# Patient Record
Sex: Female | Born: 2000 | Hispanic: Yes | Marital: Single | Smoking: Never smoker
Health system: Southern US, Community
[De-identification: ages and names within clinical notes are randomized; demographics above are authoritative.]

## PROBLEM LIST (undated history)

## (undated) DIAGNOSIS — J45909 Unspecified asthma, uncomplicated: Secondary | ICD-10-CM

---
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
## 2021-06-05 ENCOUNTER — Encounter: Payer: Self-pay | Admitting: Obstetrics and Gynecology

## 2021-06-05 ENCOUNTER — Ambulatory Visit (INDEPENDENT_AMBULATORY_CARE_PROVIDER_SITE_OTHER): Payer: Medicaid Other | Admitting: Obstetrics and Gynecology

## 2021-06-05 ENCOUNTER — Other Ambulatory Visit: Payer: Self-pay

## 2021-06-05 VITALS — BP 129/65 | HR 76 | Resp 16 | Ht 65.0 in | Wt 242.5 lb

## 2021-06-05 DIAGNOSIS — R102 Pelvic and perineal pain: Secondary | ICD-10-CM | POA: Diagnosis not present

## 2021-06-05 DIAGNOSIS — R109 Unspecified abdominal pain: Secondary | ICD-10-CM | POA: Diagnosis not present

## 2021-06-05 DIAGNOSIS — N912 Amenorrhea, unspecified: Secondary | ICD-10-CM

## 2021-06-05 DIAGNOSIS — Z6841 Body Mass Index (BMI) 40.0 and over, adult: Secondary | ICD-10-CM

## 2021-06-05 LAB — POCT URINALYSIS DIPSTICK
Bilirubin, UA: NEGATIVE
Blood, UA: NEGATIVE
Glucose, UA: NEGATIVE
Ketones, UA: NEGATIVE
Leukocytes, UA: NEGATIVE
Nitrite, UA: NEGATIVE
Protein, UA: POSITIVE — AB
Spec Grav, UA: 1.02 (ref 1.010–1.025)
Urobilinogen, UA: 0.2 E.U./dL
pH, UA: 6 (ref 5.0–8.0)

## 2021-06-05 LAB — POCT URINE PREGNANCY: Preg Test, Ur: NEGATIVE

## 2021-06-05 NOTE — Progress Notes (Signed)
HPI:      Sherry Banks is a 20 y.o. No obstetric history on file. who LMP was No LMP recorded.  Subjective:   She presents today with complaint of intermittent pelvic pain.  She states that she gets it several days each month about the time when "I usually would expect my period" she reports the pain as noncrampy.  Points to midline lower abdomen.  She does occasionally have pain with intercourse but it is not the same pain.  The pain she describes above is not related to intercourse, bowel movements, urination or any other specific activity.  She does not have normal regular monthly menses in fact has not had a period in more than 9 months.  She states that ever since menarche she has been having irregular cycles.  The only time there was any regularity and when it was not heavy was for a 23-month period while she was on OCPs.  She is concerned about not having periods but does not want to have extremely heavy periods each month either.  She is currently using condoms for birth control. She had a partial endocrine work-up performed by her family physician which showed normal FSH and LH, normal TSH normal prolactin and an elevated hemoglobin A1c (consistent with prediabetes).    Hx: The following portions of the patient's history were reviewed and updated as appropriate:             She  has no past medical history on file. She does not have a problem list on file. She  has no past surgical history on file. Her family history is not on file. She  reports that she has never smoked. She has never used smokeless tobacco. She reports that she does not drink alcohol and does not use drugs. She has a current medication list which includes the following prescription(s): albuterol, fluticasone, and naproxen. She has No Known Allergies.       Review of Systems:  Review of Systems  Constitutional: Denied constitutional symptoms, night sweats, recent illness, fatigue, fever, insomnia and weight  loss.  Eyes: Denied eye symptoms, eye pain, photophobia, vision change and visual disturbance.  Ears/Nose/Throat/Neck: Denied ear, nose, throat or neck symptoms, hearing loss, nasal discharge, sinus congestion and sore throat.  Cardiovascular: Denied cardiovascular symptoms, arrhythmia, chest pain/pressure, edema, exercise intolerance, orthopnea and palpitations.  Respiratory: Denied pulmonary symptoms, asthma, pleuritic pain, productive sputum, cough, dyspnea and wheezing.  Gastrointestinal: Denied, gastro-esophageal reflux, melena, nausea and vomiting.  Genitourinary: See HPI for additional information.  Musculoskeletal: Denied musculoskeletal symptoms, stiffness, swelling, muscle weakness and myalgia.  Dermatologic: Denied dermatology symptoms, rash and scar.  Neurologic: Denied neurology symptoms, dizziness, headache, neck pain and syncope.  Psychiatric: Denied psychiatric symptoms, anxiety and depression.  Endocrine: See HPI for additional information.   Meds:   Current Outpatient Medications on File Prior to Visit  Medication Sig Dispense Refill   albuterol (VENTOLIN HFA) 108 (90 Base) MCG/ACT inhaler Inhale into the lungs.     fluticasone (FLOVENT HFA) 110 MCG/ACT inhaler Inhale into the lungs.     naproxen (NAPROSYN) 500 MG tablet Take 1 tablet (500 mg total) by mouth 2 (two) times daily with a meal. 30 tablet 0   No current facility-administered medications on file prior to visit.      Objective:     Vitals:   06/05/21 1122  BP: 129/65  Pulse: 76  Resp: 16   Filed Weights   06/05/21 1122  Weight: 242 lb  8 oz (110 kg)                        Assessment:    No obstetric history on file. There are no problems to display for this patient.    1. Pelvic pain in female   2. Abdominal pain, unspecified abdominal location   3. Amenorrhea   4. Morbid obesity with BMI of 40.0-44.9, adult [facility])     Patient possibly has PCO based on likely anovulation, obesity, and  elevated hemoglobin A1c with likely insulin resistance.  Unknown source of pelvic pain.   Plan:            1.  Pelvic ultrasound to further evaluate uterus and ovaries  2.  Recommend completing work-up for possible PCO.  3.  We have briefly discussed cycle control using hormonal methods of birth control. Orders Orders Placed This Encounter  Procedures   US PELVIS (TRANSABDOMINAL ONLY)   US PELVIS TRANSVAGINAL NON-OB (TV ONLY)   DHEA-sulfate   Glucose, fasting   Insulin, random   Testosterone, Free, Total, SHBG   POCT Urinalysis Dipstick   POCT urine pregnancy    No orders of the defined types were placed in this encounter.     F/U  Return in about 3 weeks (around 06/26/2021). I spent 32 minutes involved in the care of this patient preparing to see the patient by obtaining and reviewing her medical history (including labs, imaging tests and prior procedures), documenting clinical information in the electronic health record (EHR), counseling and coordinating care plans, writing and sending prescriptions, ordering tests or procedures and in direct communicating with the patient and medical staff discussing pertinent items from her history and physical exam.  Elonda Husky, M.D. 06/05/2021 11:50 AM

## 2021-06-28 ENCOUNTER — Ambulatory Visit (INDEPENDENT_AMBULATORY_CARE_PROVIDER_SITE_OTHER): Payer: Medicaid Other

## 2021-06-28 ENCOUNTER — Other Ambulatory Visit: Payer: Medicaid Other

## 2021-06-28 ENCOUNTER — Other Ambulatory Visit: Payer: Self-pay

## 2021-06-28 ENCOUNTER — Other Ambulatory Visit: Payer: Self-pay | Admitting: Obstetrics and Gynecology

## 2021-06-28 DIAGNOSIS — R102 Pelvic and perineal pain: Secondary | ICD-10-CM

## 2021-06-30 LAB — TESTOSTERONE, FREE, TOTAL, SHBG
Sex Hormone Binding: 21.8 nmol/L — ABNORMAL LOW (ref 24.6–122.0)
Testosterone, Free: 3.8 pg/mL
Testosterone: 29 ng/dL (ref 13–71)

## 2021-06-30 LAB — DHEA-SULFATE: DHEA-SO4: 121 ug/dL (ref 110.0–433.2)

## 2021-06-30 LAB — GLUCOSE, FASTING: Glucose, Plasma: 85 mg/dL (ref 70–99)

## 2021-06-30 LAB — INSULIN, RANDOM: INSULIN: 63.6 u[IU]/mL — ABNORMAL HIGH (ref 2.6–24.9)

## 2021-07-06 ENCOUNTER — Encounter: Payer: Self-pay | Admitting: Obstetrics and Gynecology

## 2021-07-06 ENCOUNTER — Other Ambulatory Visit: Payer: Self-pay

## 2021-07-06 ENCOUNTER — Ambulatory Visit (INDEPENDENT_AMBULATORY_CARE_PROVIDER_SITE_OTHER): Payer: Medicaid Other | Admitting: Obstetrics and Gynecology

## 2021-07-06 VITALS — BP 114/75 | HR 85 | Resp 16 | Ht 65.0 in | Wt 236.8 lb

## 2021-07-06 DIAGNOSIS — R7303 Prediabetes: Secondary | ICD-10-CM

## 2021-07-06 DIAGNOSIS — N912 Amenorrhea, unspecified: Secondary | ICD-10-CM | POA: Diagnosis not present

## 2021-07-06 DIAGNOSIS — N970 Female infertility associated with anovulation: Secondary | ICD-10-CM | POA: Diagnosis not present

## 2021-07-06 MED ORDER — DESOGESTREL-ETHINYL ESTRADIOL 0.15-0.02/0.01 MG (21/5) PO TABS: 1.0000 | ORAL_TABLET | Freq: Every day | ORAL | 1 refills | Status: DC

## 2021-07-06 MED ORDER — METFORMIN HCL 500 MG PO TABS: 500.0000 mg | ORAL_TABLET | Freq: Two times a day (BID) | ORAL | 1 refills | Status: DC

## 2021-07-06 NOTE — Progress Notes (Signed)
HPI:      [name] is a 20 y.o. No obstetric history on file. who LMP was Patient's last menstrual period was 07/06/2021 (exact date).  Subjective:   She presents today to discuss her oligo ovulation/oligomenorrhea as well as her most recent ultrasound.  She states that she has had a period last week and it was very heavy as if she has not had a period in several months. Her lab work also revealed apparent insulin resistance/prediabetes.    Hx: The following portions of the patient's history were reviewed and updated as appropriate:             She  has no past medical history on file. She does not have a problem list on file. She  has no past surgical history on file. Her family history is not on file. She  reports that she has never smoked. She has never used smokeless tobacco. She reports that she does not drink alcohol and does not use drugs. She has a current medication list which includes the following prescription(s): albuterol, desogestrel-ethinyl estradiol, fluticasone, and metformin. She has No Known Allergies.       Review of Systems:  Review of Systems  Constitutional: Denied constitutional symptoms, night sweats, recent illness, fatigue, fever, insomnia and weight loss.  Eyes: Denied eye symptoms, eye pain, photophobia, vision change and visual disturbance.  Ears/Nose/Throat/Neck: Denied ear, nose, throat or neck symptoms, hearing loss, nasal discharge, sinus congestion and sore throat.  Cardiovascular: Denied cardiovascular symptoms, arrhythmia, chest pain/pressure, edema, exercise intolerance, orthopnea and palpitations.  Respiratory: Denied pulmonary symptoms, asthma, pleuritic pain, productive sputum, cough, dyspnea and wheezing.  Gastrointestinal: Denied, gastro-esophageal reflux, melena, nausea and vomiting.  Genitourinary: See HPI for additional information.  Musculoskeletal: Denied musculoskeletal symptoms, stiffness, swelling, muscle weakness and  myalgia.  Dermatologic: Denied dermatology symptoms, rash and scar.  Neurologic: Denied neurology symptoms, dizziness, headache, neck pain and syncope.  Psychiatric: Denied psychiatric symptoms, anxiety and depression.  Endocrine: See HPI for additional information.   Meds:   Current Outpatient Medications on File Prior to Visit  Medication Sig Dispense Refill   albuterol (VENTOLIN HFA) 108 (90 Base) MCG/ACT inhaler Inhale into the lungs.     fluticasone (FLOVENT HFA) 110 MCG/ACT inhaler Inhale into the lungs.     No current facility-administered medications on file prior to visit.      Objective:     Vitals:   07/06/21 1523  BP: 114/75  Pulse: 85  Resp: 16   Filed Weights   07/06/21 1523  Weight: 236 lb 12.8 oz (107.4 kg)              Ultrasound results reviewed directly with the patient          Assessment:    No obstetric history on file. There are no problems to display for this patient.    1. Oligo-ovulation with amenorrhea   2. Pre-diabetes        Plan:            1.  Patient would like to start OCPs to regulate her cycle and for birth control.  We will start Mircette. OCPs The risks /benefits of OCPs have been explained to the patient in detail.  Product literature has been given to her where appropriate.  I have instructed her in the use of OCPs.  I have explained to the patient that OCPs are not as effective for birth control during the first month of use, and that  another form of contraception should be used during this time.  Both first-day start and Sunday start have been explained.  The risks and benefits of each was discussed.  She has been made aware of  the fact that in rare circumstances, other medications may affect the efficacy of OCPs.  I have answered all of her questions, and I believe that she has an understanding of the effectiveness and use of OCPs.   2.  We will start metformin for prediabetes -plan recheck hemoglobin A1c in 3 months.   Discussed possibility of dietary modification and weight loss. Orders No orders of the defined types were placed in this encounter.    Meds ordered this encounter  Medications   metFORMIN (GLUCOPHAGE) 500 MG tablet    Sig: Take 1 tablet (500 mg total) by mouth 2 (two) times daily with a meal. 500 at bedtime for 1 wk.  Then increase to twice per day with meals as directed.    Dispense:  100 tablet    Refill:  1   desogestrel-ethinyl estradiol (MIRCETTE) 0.15-0.02/0.01 MG (21/5) tablet    Sig: Take 1 tablet by mouth at bedtime.    Dispense:  84 tablet    Refill:  1      F/U  Return in about 3 months (around 10/06/2021). I spent 23 minutes involved in the care of this patient preparing to see the patient by obtaining and reviewing her medical history (including labs, imaging tests and prior procedures), documenting clinical information in the electronic health record (EHR), counseling and coordinating care plans, writing and sending prescriptions, ordering tests or procedures and in direct communicating with the patient and medical staff discussing pertinent items from her history and physical exam.  Elonda Husky, M.D. 07/06/2021 3:47 PM

## 2021-10-06 ENCOUNTER — Other Ambulatory Visit: Payer: Self-pay

## 2021-10-06 ENCOUNTER — Encounter: Payer: Self-pay | Admitting: Obstetrics and Gynecology

## 2021-10-06 ENCOUNTER — Ambulatory Visit (INDEPENDENT_AMBULATORY_CARE_PROVIDER_SITE_OTHER): Payer: Medicaid Other | Admitting: Obstetrics and Gynecology

## 2021-10-06 VITALS — BP 108/76 | HR 76 | Ht 65.0 in | Wt 219.3 lb

## 2021-10-06 DIAGNOSIS — N912 Amenorrhea, unspecified: Secondary | ICD-10-CM | POA: Diagnosis not present

## 2021-10-06 DIAGNOSIS — N970 Female infertility associated with anovulation: Secondary | ICD-10-CM | POA: Diagnosis not present

## 2021-10-06 DIAGNOSIS — R7303 Prediabetes: Secondary | ICD-10-CM | POA: Diagnosis not present

## 2021-10-06 MED ORDER — METFORMIN HCL 500 MG PO TABS: 500.0000 mg | ORAL_TABLET | Freq: Two times a day (BID) | ORAL | 1 refills | Status: DC

## 2021-10-06 MED ORDER — DESOGESTREL-ETHINYL ESTRADIOL 0.15-0.02/0.01 MG (21/5) PO TABS: 1.0000 | ORAL_TABLET | Freq: Every day | ORAL | 1 refills | Status: DC

## 2021-10-06 MED ORDER — METFORMIN HCL 500 MG PO TABS: 500.0000 mg | ORAL_TABLET | Freq: Two times a day (BID) | ORAL | 1 refills | Status: AC

## 2021-10-06 NOTE — Addendum Note (Signed)
Addended by: Loman Chroman on: 10/06/2021 09:47 AM   Modules accepted: Orders

## 2021-10-06 NOTE — Progress Notes (Signed)
Patient states periods have started to become regular over the past 2 months. Patient states not taking her previously prescribed OCP's or metformin. Patient states no further questions.

## 2021-10-06 NOTE — Progress Notes (Signed)
HPI:      [name] is a 21 y.o. No obstetric history on file. who LMP was Patient's last menstrual period was 10/04/2021 (approximate).  Subjective:   She presents today for scheduled follow-up regarding OCPs and metformin.  However, she states that the prescription was "sent to the wrong pharmacy" and she never got it.  She does report that she has now started to have regular monthly menstrual periods.  She continues to use condoms for birth control but would still like to start OCPs.    Hx: The following portions of the patient's history were reviewed and updated as appropriate:             She  has no past medical history on file. She does not have a problem list on file. She  has no past surgical history on file. Her family history is not on file. She  reports that she has never smoked. She has never used smokeless tobacco. She reports that she does not drink alcohol and does not use drugs. She has a current medication list which includes the following prescription(s): albuterol, fluticasone, desogestrel-ethinyl estradiol, and metformin. She has No Known Allergies.       Review of Systems:  Review of Systems  Constitutional: Denied constitutional symptoms, night sweats, recent illness, fatigue, fever, insomnia and weight loss.  Eyes: Denied eye symptoms, eye pain, photophobia, vision change and visual disturbance.  Ears/Nose/Throat/Neck: Denied ear, nose, throat or neck symptoms, hearing loss, nasal discharge, sinus congestion and sore throat.  Cardiovascular: Denied cardiovascular symptoms, arrhythmia, chest pain/pressure, edema, exercise intolerance, orthopnea and palpitations.  Respiratory: Denied pulmonary symptoms, asthma, pleuritic pain, productive sputum, cough, dyspnea and wheezing.  Gastrointestinal: Denied, gastro-esophageal reflux, melena, nausea and vomiting.  Genitourinary: Denied genitourinary symptoms including symptomatic vaginal discharge, pelvic  relaxation issues, and urinary complaints.  Musculoskeletal: Denied musculoskeletal symptoms, stiffness, swelling, muscle weakness and myalgia.  Dermatologic: Denied dermatology symptoms, rash and scar.  Neurologic: Denied neurology symptoms, dizziness, headache, neck pain and syncope.  Psychiatric: Denied psychiatric symptoms, anxiety and depression.  Endocrine: Denied endocrine symptoms including hot flashes and night sweats.   Meds:   Current Outpatient Medications on File Prior to Visit  Medication Sig Dispense Refill   albuterol (VENTOLIN HFA) 108 (90 Base) MCG/ACT inhaler Inhale into the lungs.     fluticasone (FLOVENT HFA) 110 MCG/ACT inhaler Inhale into the lungs.     No current facility-administered medications on file prior to visit.      Objective:     Vitals:   10/06/21 0851  BP: 108/76  Pulse: 76   Filed Weights   10/06/21 0851  Weight: 219 lb 4.8 oz (99.5 kg)                        Assessment:    No obstetric history on file. There are no problems to display for this patient.    1. Oligo-ovulation with amenorrhea   2. Pre-diabetes        Plan:            1.  Patient will start OCPs as previously discussed.   OCPs The risks /benefits of OCPs have been explained to the patient in detail.  Product literature has been given to her where appropriate.  I have instructed her in the use of OCPs.  I have explained to the patient that OCPs are not as effective for birth control during the first month of use, and  that another form of contraception should be used during this time.  Both first-day start and Sunday start have been explained.  The risks and benefits of each was discussed.  She has been made aware of  the fact that in rare circumstances, other medications may affect the efficacy of OCPs.  I have answered all of her questions, and I believe that she has an understanding of the effectiveness and use of OCPs.  2.  Begin metformin as previously  discussed.  Plan follow-up for OCPs and metformin in 3 months.  We will obtain hemoglobin A1c at that time. Orders No orders of the defined types were placed in this encounter.    Meds ordered this encounter  Medications   desogestrel-ethinyl estradiol (MIRCETTE) 0.15-0.02/0.01 MG (21/5) tablet    Sig: Take 1 tablet by mouth at bedtime.    Dispense:  84 tablet    Refill:  1   metFORMIN (GLUCOPHAGE) 500 MG tablet    Sig: Take 1 tablet (500 mg total) by mouth 2 (two) times daily with a meal. 500 at bedtime for 1 wk.  Then increase to twice per day with meals as directed.    Dispense:  100 tablet    Refill:  1      F/U  Return in about 3 months (around 01/03/2022). I spent 21 minutes involved in the care of this patient preparing to see the patient by obtaining and reviewing her medical history (including labs, imaging tests and prior procedures), documenting clinical information in the electronic health record (EHR), counseling and coordinating care plans, writing and sending prescriptions, ordering tests or procedures and in direct communicating with the patient and medical staff discussing pertinent items from her history and physical exam.  Finis Bud, M.D. 10/06/2021 9:24 AM

## 2021-10-11 ENCOUNTER — Other Ambulatory Visit: Payer: Self-pay

## 2021-10-11 DIAGNOSIS — N912 Amenorrhea, unspecified: Secondary | ICD-10-CM

## 2021-10-11 DIAGNOSIS — N970 Female infertility associated with anovulation: Secondary | ICD-10-CM

## 2021-10-11 MED ORDER — DESOGESTREL-ETHINYL ESTRADIOL 0.15-0.02/0.01 MG (21/5) PO TABS: 1.0000 | ORAL_TABLET | Freq: Every day | ORAL | 1 refills | Status: AC

## 2021-10-11 NOTE — Telephone Encounter (Signed)
Patient requested birth control be sent to [facility] on [address]. Medication has been sent and patient made aware.

## 2022-01-03 ENCOUNTER — Encounter: Payer: Medicaid Other | Admitting: Obstetrics and Gynecology

## 2022-01-03 DIAGNOSIS — R7303 Prediabetes: Secondary | ICD-10-CM

## 2022-01-03 DIAGNOSIS — N912 Amenorrhea, unspecified: Secondary | ICD-10-CM

## 2023-03-26 ENCOUNTER — Emergency Department
Admission: EM | Admit: 2023-03-26 | Discharge: 2023-03-26 | Disposition: A | Discharge status: Home / Self Care | Payer: Medicaid Other | Attending: Student in an Organized Health Care Education/Training Program | Admitting: Student in an Organized Health Care Education/Training Program

## 2023-03-26 ENCOUNTER — Other Ambulatory Visit: Payer: Self-pay

## 2023-03-26 ENCOUNTER — Emergency Department: Payer: Medicaid Other

## 2023-03-26 DIAGNOSIS — B349 Viral infection, unspecified: Secondary | ICD-10-CM | POA: Insufficient documentation

## 2023-03-26 DIAGNOSIS — J40 Bronchitis, not specified as acute or chronic: Secondary | ICD-10-CM

## 2023-03-26 DIAGNOSIS — R509 Fever, unspecified: Secondary | ICD-10-CM | POA: Diagnosis present

## 2023-03-26 DIAGNOSIS — Z20822 Contact with and (suspected) exposure to covid-19: Secondary | ICD-10-CM | POA: Diagnosis not present

## 2023-03-26 LAB — SARS CORONAVIRUS 2 BY RT PCR: SARS Coronavirus 2 by RT PCR: NEGATIVE

## 2023-03-26 LAB — GROUP A STREP BY PCR: Group A Strep by PCR: NOT DETECTED

## 2023-03-26 MED ORDER — ACETAMINOPHEN 500 MG PO TABS
1000.0000 mg | ORAL_TABLET | Freq: Once | ORAL | Status: AC
  Administered 2023-03-26: 1000 mg via ORAL
  Filled 2023-03-26: qty 2

## 2023-03-26 MED ORDER — PSEUDOEPH-BROMPHEN-DM 30-2-10 MG/5ML PO SYRP: 10.0000 mL | ORAL_SOLUTION | Freq: Four times a day (QID) | ORAL | 0 refills | Status: DC | PRN

## 2023-03-26 MED ORDER — PREDNISONE 20 MG PO TABS
60.0000 mg | ORAL_TABLET | Freq: Once | ORAL | Status: AC
  Administered 2023-03-26: 60 mg via ORAL
  Filled 2023-03-26: qty 3

## 2023-03-26 MED ORDER — PREDNISONE 50 MG PO TABS: 50.0000 mg | ORAL_TABLET | Freq: Every day | ORAL | 0 refills | Status: DC

## 2023-03-26 MED ORDER — IBUPROFEN 600 MG PO TABS
600.0000 mg | ORAL_TABLET | Freq: Once | ORAL | Status: AC
  Administered 2023-03-26: 600 mg via ORAL
  Filled 2023-03-26: qty 1

## 2023-03-26 MED ORDER — BENZONATATE 100 MG PO CAPS: 100.0000 mg | ORAL_CAPSULE | Freq: Three times a day (TID) | ORAL | 0 refills | Status: DC | PRN

## 2023-03-26 NOTE — ED Triage Notes (Signed)
Pt presents to ER with c/o dry cough, sore throat, body aches and fever that started 8/4.  Pt reports she has been taking OTC for cough/cold symptoms, but states she has had little to no relief.  Pt reports she has worked around people who have been sick with covid.  Pt is otherwise A&O x4 and in NAD in triage.

## 2023-03-26 NOTE — ED Provider Notes (Signed)
[facility] Provider Note  Patient Contact: 9:35 PM (approximate)   History   Fever and Cough   HPI  Sherry Banks is a 22 y.o. female who for possible evaluation of COVID.  Patient states that she has been around somebody that was positive for COVID.  She has had congestion, cough, wheezing, posttussive emesis.  Patient states that symptoms have been ongoing x 3 days.  Patient denies chest pain, abdominal pain, urinary symptoms.     Physical Exam   Triage Vital Signs: ED Triage Vitals  Encounter Vitals Group     BP 03/26/23 2003 122/77     Systolic BP Percentile --      Diastolic BP Percentile --      Pulse Rate 03/26/23 2003 (!) 125     Resp 03/26/23 2003 (!) 22     Temp 03/26/23 2003 (!) 101.5 F (38.6 C)     Temp Source 03/26/23 2003 Oral     SpO2 03/26/23 2003 96 %     Weight --      Height --      Head Circumference --      Peak Flow --      Pain Score 03/26/23 2100 3     Pain Loc --      Pain Education --      Exclude from Growth Chart --     Most recent vital signs: Vitals:   03/26/23 2145 03/26/23 2156  BP:    Pulse:  (!) 109  Resp:  20  Temp: 99.8 F (37.7 C)   SpO2:  97%     General: Alert and in no acute distress. ENT:      Ears:       Nose: No congestion/rhinnorhea.      Mouth/Throat: Mucous membranes are moist. Neck: No stridor. No cervical spine tenderness to palpation.  Cardiovascular:  Good peripheral perfusion Respiratory: Normal respiratory effort without tachypnea or retractions. Lungs CTAB. Good air entry to the bases with no decreased or absent breath sounds. Musculoskeletal: Full range of motion to all extremities.  Neurologic:  No gross focal neurologic deficits are appreciated.  Skin:   No rash noted Other:   ED Results / Procedures / Treatments   Labs (all labs ordered are listed, but only abnormal results are displayed) Labs Reviewed  SARS CORONAVIRUS 2 BY RT PCR  GROUP A STREP BY PCR      EKG     RADIOLOGY  I personally viewed, evaluated, and interpreted these images as part of my medical decision making, as well as reviewing the written report by the radiologist.  ED Provider Interpretation: No acute consolidation concerning for pneumonia  DG Chest 2 View  Result Date: 03/26/2023 CLINICAL DATA:  Cough, sore throat, body aches and fever. EXAM: CHEST - 2 VIEW COMPARISON:  March 18, 2005 FINDINGS: The heart size and mediastinal contours are within normal limits. Low lung volumes are noted. There is no evidence of acute infiltrate, pleural effusion or pneumothorax. The visualized skeletal structures are unremarkable. IMPRESSION: No active cardiopulmonary disease. Electronically Signed   By: Aram Candela M.D.   On: 03/26/2023 21:51    PROCEDURES:  Critical Care performed: No  Procedures   MEDICATIONS ORDERED IN ED: Medications  acetaminophen (TYLENOL) tablet 1,000 mg (1,000 mg Oral Given 03/26/23 2009)  ibuprofen (ADVIL) tablet 600 mg (600 mg Oral Given 03/26/23 2143)  predniSONE (DELTASONE) tablet 60 mg (60 mg Oral Given 03/26/23 2213)  IMPRESSION / MDM / ASSESSMENT AND PLAN / ED COURSE  I reviewed the triage vital signs and the nursing notes.                                 Differential diagnosis includes, but is not limited to, COVID, flu, bronchitis, pneumonia   Patient's presentation is most consistent with acute presentation with potential threat to life or bodily function.   Patient's diagnosis is consistent with viral illness with bronchitis.  Patient presents emergency department with COVID-like symptoms.  She had a negative flu and COVID swab, however given the prevalence in the community, symptoms I highly suspect that patient does in fact have COVID.  Patient also has symptoms consistent with bronchitis.  Negative chest x-ray for pneumonia.  At this point in time we will treat with a steroid, she has albuterol inhaler already at home and is  encouraged to use same, and will have seen the controlled medication with 2 different cough medications.  Concerning signs and symptoms and return precautions discussed with the patient.  Follow-up with primary care as needed..  Patient is given ED precautions to return to the ED for any worsening or new symptoms.     FINAL CLINICAL IMPRESSION(S) / ED DIAGNOSES   Final diagnoses:  Viral illness  Bronchitis     Rx / DC Orders   ED Discharge Orders          Ordered    predniSONE (DELTASONE) 50 MG tablet  Daily with breakfast        03/26/23 2220    brompheniramine-pseudoephedrine-DM 30-2-10 MG/5ML syrup  4 times daily PRN        03/26/23 2220    benzonatate (TESSALON PERLES) 100 MG capsule  3 times daily PRN        03/26/23 2220             Note:  This document was prepared using Dragon voice recognition software and may include unintentional dictation errors.   Lanette Hampshire 03/26/23 2222    Willy Eddy, MD 03/26/23 [phone]

## 2023-09-09 ENCOUNTER — Ambulatory Visit
Admission: EM | Admit: 2023-09-09 | Discharge: 2023-09-09 | Disposition: A | Discharge status: Home / Self Care | Payer: Medicaid Other | Attending: Emergency Medicine | Admitting: Emergency Medicine

## 2023-09-09 DIAGNOSIS — J45901 Unspecified asthma with (acute) exacerbation: Secondary | ICD-10-CM | POA: Diagnosis not present

## 2023-09-09 DIAGNOSIS — R42 Dizziness and giddiness: Secondary | ICD-10-CM | POA: Diagnosis not present

## 2023-09-09 DIAGNOSIS — R0602 Shortness of breath: Secondary | ICD-10-CM

## 2023-09-09 HISTORY — DX: Unspecified asthma, uncomplicated: J45.909

## 2023-09-09 LAB — POC COVID19/FLU A&B COMBO
Covid Antigen, POC: NEGATIVE
Influenza A Antigen, POC: NEGATIVE
Influenza B Antigen, POC: NEGATIVE

## 2023-09-09 MED ORDER — PREDNISONE 10 MG PO TABS: 40.0000 mg | ORAL_TABLET | Freq: Every day | ORAL | 0 refills | Status: AC

## 2023-09-09 NOTE — Discharge Instructions (Addendum)
Take the prednisone as directed.  Use your inhalers as directed.    Follow up with your primary care provider tomorrow.  Go to the emergency department if you have worsening symptoms.    The flu and COVID tests are negative.

## 2023-09-09 NOTE — ED Triage Notes (Addendum)
Patient to Urgent Care with complaints of nasal congestion/ cough/ chest tightness. Reports she woke up during the night feeling short of breath.  Symptoms x2 days. Reports family members with flu/ RSV.   Dizziness x3 weeks.

## 2023-09-09 NOTE — ED Provider Notes (Signed)
Sherry Banks    CSN: [id number] Arrival date & time: 09/09/23  1059      History   Chief Complaint Chief Complaint  Patient presents with   Cough    HPI Sherry Banks is a 23 y.o. female.  Patient presents with 2-day history of congestion and cough.  She reports intermittent shortness of breath, worse at night.  She reports family members tested positive for influenza and RSV last week.  Patient also reports intermittent dizziness x 3 weeks.  No fever, chest pain, numbness, or weakness.  Last used her albuterol inhaler at 0400 this morning.  The history is provided by the patient and medical records.    Past Medical History:  Diagnosis Date   Asthma     There are no active problems to display for this patient.   History reviewed. No pertinent surgical history.  OB History   No obstetric history on file.      Home Medications    Prior to Admission medications   Medication Sig Start Date End Date Taking? Authorizing Provider  predniSONE (DELTASONE) 10 MG tablet Take 4 tablets (40 mg total) by mouth daily for 5 days. 09/09/23 09/14/23 Yes Mickie Bail, NP  albuterol (VENTOLIN HFA) 108 (90 Base) MCG/ACT inhaler Inhale into the lungs.    [provider]  desogestrel-ethinyl estradiol (MIRCETTE) 0.15-0.02/0.01 MG (21/5) tablet Take 1 tablet by mouth at bedtime. 10/11/21   Linzie Collin, MD  fluticasone (FLOVENT HFA) 110 MCG/ACT inhaler Inhale into the lungs.    [provider]  metFORMIN (GLUCOPHAGE) 500 MG tablet Take 1 tablet (500 mg total) by mouth 2 (two) times daily with a meal. 500 at bedtime for 1 wk.  Then increase to twice per day with meals as directed. 10/06/21   Linzie Collin, MD    Family History History reviewed. No pertinent family history.  Social History Social History   Tobacco Use   Smoking status: Never   Smokeless tobacco: Never  Substance Use Topics   Alcohol use: Never   Drug use: Never      Allergies   Patient has no known allergies.   Review of Systems Review of Systems  Constitutional:  Negative for chills and fever.  HENT:  Negative for ear pain and sore throat.   Respiratory:  Positive for cough and shortness of breath. Negative for wheezing.   Cardiovascular:  Negative for chest pain and palpitations.  Gastrointestinal:  Negative for diarrhea and vomiting.  Neurological:  Positive for dizziness, weakness and numbness.     Physical Exam Triage Vital Signs ED Triage Vitals  Encounter Vitals Group     BP 09/09/23 1107 123/82     Systolic BP Percentile --      Diastolic BP Percentile --      Pulse Rate 09/09/23 1107 85     Resp 09/09/23 1107 18     Temp 09/09/23 1107 (!) 97.5 F (36.4 C)     Temp src --      SpO2 09/09/23 1107 97 %     Weight --      Height --      Head Circumference --      Peak Flow --      Pain Score 09/09/23 1117 4     Pain Loc --      Pain Education --      Exclude from Growth Chart --    No data found.  Updated Vital Signs  BP 123/82   Pulse 85   Temp (!) 97.5 F (36.4 C)   Resp 18   SpO2 97%   Visual Acuity Right Eye Distance:   Left Eye Distance:   Bilateral Distance:    Right Eye Near:   Left Eye Near:    Bilateral Near:     Physical Exam Constitutional:      General: She is not in acute distress.    Appearance: She is obese.  HENT:     Right Ear: Tympanic membrane normal.     Left Ear: Tympanic membrane normal.     Nose: Nose normal.     Mouth/Throat:     Mouth: Mucous membranes are moist.     Pharynx: Oropharynx is clear.  Cardiovascular:     Rate and Rhythm: Normal rate and regular rhythm.     Heart sounds: Normal heart sounds.  Pulmonary:     Effort: Pulmonary effort is normal. No respiratory distress.     Breath sounds: Normal breath sounds. No wheezing.  Skin:    General: Skin is warm and dry.  Neurological:     General: No focal deficit present.     Mental Status: She is alert and  oriented to person, place, and time.     Sensory: No sensory deficit.     Motor: No weakness.     Gait: Gait normal.      UC Treatments / Results  Labs (all labs ordered are listed, but only abnormal results are displayed) Labs Reviewed  POC COVID19/FLU A&B COMBO    EKG   Radiology No results found.  Procedures Procedures (including critical care time)  Medications Ordered in UC Medications - No data to display  Initial Impression / Assessment and Plan / UC Course  I have reviewed the triage vital signs and the nursing notes.  Pertinent labs & imaging results that were available during my care of the patient were reviewed by me and considered in my medical decision making (see chart for details).    Shortness of breath, asthma exacerbation, dizziness.  Afebrile and vital signs are stable.  Lungs are clear and O2 sat 97% on room air.  Rapid flu and COVID-negative.  Work note provided per patient request.  Treating asthma exacerbation with prednisone and continued use of inhalers.  Patient reports her inhalers are in date and have plenty of activations; she states she does not need a refill.  Instructed her to follow-up with her PCP tomorrow.  ED precautions given.  Education provided on asthma, shortness of breath, dizziness.  Patient agrees to plan of care.  Final Clinical Impressions(s) / UC Diagnoses   Final diagnoses:  Shortness of breath  Asthma with acute exacerbation, unspecified asthma severity, unspecified whether persistent  Dizziness     Discharge Instructions      Take the prednisone as directed.  Use your inhalers as directed.    Follow up with your primary care provider tomorrow.  Go to the emergency department if you have worsening symptoms.    The flu and COVID tests are negative.         ED Prescriptions     Medication Sig Dispense Auth. Provider   predniSONE (DELTASONE) 10 MG tablet Take 4 tablets (40 mg total) by mouth daily for 5 days.  20 tablet Mickie Bail, NP      PDMP not reviewed this encounter.   Mickie Bail, NP 09/09/23 [phone]
